# Patient Record
Sex: Male | Born: 1970 | Race: White | Hispanic: No | Smoking: Current every day smoker
Health system: Southern US, Community
[De-identification: ages and names within clinical notes are randomized; demographics above are authoritative.]

## PROBLEM LIST (undated history)

## (undated) DIAGNOSIS — I639 Cerebral infarction, unspecified: Secondary | ICD-10-CM

## (undated) DIAGNOSIS — E119 Type 2 diabetes mellitus without complications: Secondary | ICD-10-CM

## (undated) DIAGNOSIS — I10 Essential (primary) hypertension: Secondary | ICD-10-CM

## (undated) DIAGNOSIS — J45909 Unspecified asthma, uncomplicated: Secondary | ICD-10-CM

## (undated) HISTORY — PX: LEG SURGERY: SHX1003

## (undated) HISTORY — PX: BACK SURGERY: SHX140

## (undated) HISTORY — PX: CHOLECYSTECTOMY: SHX55

---

## 2013-07-06 ENCOUNTER — Emergency Department: Payer: Self-pay | Admitting: Emergency Medicine

## 2013-07-06 LAB — CBC
HCT: 47.5 % (ref 40.0–52.0)
HGB: 16.5 g/dL (ref 13.0–18.0)
MCHC: 34.8 g/dL (ref 32.0–36.0)
MCV: 90 fL (ref 80–100)
Platelet: 223 10*3/uL (ref 150–440)
RDW: 13 % (ref 11.5–14.5)
WBC: 11 10*3/uL — ABNORMAL HIGH (ref 3.8–10.6)

## 2013-07-06 LAB — BASIC METABOLIC PANEL
Anion Gap: 5 — ABNORMAL LOW (ref 7–16)
Calcium, Total: 9.2 mg/dL (ref 8.5–10.1)
Creatinine: 1.31 mg/dL — ABNORMAL HIGH (ref 0.60–1.30)
EGFR (Non-African Amer.): >60
Osmolality: 277 (ref 275–301)
Sodium: 135 mmol/L — ABNORMAL LOW (ref 136–145)

## 2017-01-25 ENCOUNTER — Emergency Department: Admission: EM | Admit: 2017-01-25 | Discharge: 2017-01-25 | Discharge status: Against Medical Advice | Payer: Self-pay

## 2017-01-28 ENCOUNTER — Telehealth: Payer: Self-pay | Admitting: Emergency Medicine

## 2017-01-28 NOTE — Telephone Encounter (Signed)
Called patient due to lwot to inquire about condition and follow up plans.  No answer and no voicemail  

## 2019-11-07 ENCOUNTER — Other Ambulatory Visit: Payer: Self-pay

## 2019-11-07 ENCOUNTER — Emergency Department: Payer: Medicare Other

## 2019-11-07 DIAGNOSIS — Z8673 Personal history of transient ischemic attack (TIA), and cerebral infarction without residual deficits: Secondary | ICD-10-CM | POA: Insufficient documentation

## 2019-11-07 DIAGNOSIS — J441 Chronic obstructive pulmonary disease with (acute) exacerbation: Secondary | ICD-10-CM | POA: Diagnosis not present

## 2019-11-07 DIAGNOSIS — Z20822 Contact with and (suspected) exposure to covid-19: Secondary | ICD-10-CM | POA: Insufficient documentation

## 2019-11-07 DIAGNOSIS — E119 Type 2 diabetes mellitus without complications: Secondary | ICD-10-CM | POA: Insufficient documentation

## 2019-11-07 DIAGNOSIS — Z9049 Acquired absence of other specified parts of digestive tract: Secondary | ICD-10-CM | POA: Diagnosis not present

## 2019-11-07 DIAGNOSIS — I10 Essential (primary) hypertension: Secondary | ICD-10-CM | POA: Diagnosis not present

## 2019-11-07 DIAGNOSIS — R519 Headache, unspecified: Secondary | ICD-10-CM | POA: Insufficient documentation

## 2019-11-07 DIAGNOSIS — J45909 Unspecified asthma, uncomplicated: Secondary | ICD-10-CM | POA: Insufficient documentation

## 2019-11-07 DIAGNOSIS — F17200 Nicotine dependence, unspecified, uncomplicated: Secondary | ICD-10-CM | POA: Insufficient documentation

## 2019-11-07 LAB — CBC
HCT: 54.8 % — ABNORMAL HIGH (ref 39.0–52.0)
Hemoglobin: 18.8 g/dL — ABNORMAL HIGH (ref 13.0–17.0)
MCH: 29.7 pg (ref 26.0–34.0)
MCHC: 34.3 g/dL (ref 30.0–36.0)
MCV: 86.7 fL (ref 80.0–100.0)
Platelets: 225 10*3/uL (ref 150–400)
RBC: 6.32 MIL/uL — ABNORMAL HIGH (ref 4.22–5.81)
RDW: 12.7 % (ref 11.5–15.5)
WBC: 10.7 10*3/uL — ABNORMAL HIGH (ref 4.0–10.5)
nRBC: 0 % (ref 0.0–0.2)

## 2019-11-07 NOTE — ED Triage Notes (Signed)
Patient coming [facility] for intermittent headache with vision loss and SOB. Patient given duoneb and solumendrol in route. Patient reports continued SOB. Patient with even and unlabored respirations, oxygen saturation of 96% on RA. Patient reports previous hx of stroke with lasting weakness to right side.

## 2019-11-07 NOTE — ED Notes (Signed)
Patient coming [facility] [place] [employer].   Intermittent headache posterior right head. Patient has hx of stroke. Patient has hx of asthma. Patient wheezing on ausculation. Patient given duoneb and 125 solumedrol in route. Wheezing resolved after treatments. Stroke screen negative. 12 lead unremarkable.  CBG 270.

## 2019-11-08 ENCOUNTER — Emergency Department
Admission: EM | Admit: 2019-11-08 | Discharge: 2019-11-08 | Disposition: A | Discharge status: Home / Self Care | Payer: Medicare Other | Attending: Emergency Medicine | Admitting: Emergency Medicine

## 2019-11-08 ENCOUNTER — Emergency Department: Payer: Medicare Other

## 2019-11-08 DIAGNOSIS — R519 Headache, unspecified: Secondary | ICD-10-CM | POA: Diagnosis not present

## 2019-11-08 DIAGNOSIS — J441 Chronic obstructive pulmonary disease with (acute) exacerbation: Secondary | ICD-10-CM

## 2019-11-08 HISTORY — DX: Type 2 diabetes mellitus without complications: E11.9

## 2019-11-08 HISTORY — DX: Cerebral infarction, unspecified: I63.9

## 2019-11-08 HISTORY — DX: Unspecified asthma, uncomplicated: J45.909

## 2019-11-08 HISTORY — DX: Essential (primary) hypertension: I10

## 2019-11-08 LAB — BASIC METABOLIC PANEL
Anion gap: 14 (ref 5–15)
BUN: 15 mg/dL (ref 6–20)
CO2: 25 mmol/L (ref 22–32)
Calcium: 9.1 mg/dL (ref 8.9–10.3)
Chloride: 96 mmol/L — ABNORMAL LOW (ref 98–111)
Creatinine, Ser: 1.06 mg/dL (ref 0.61–1.24)
GFR calc Af Amer: >60 mL/min (ref >60)
GFR calc non Af Amer: >60 mL/min (ref >60)
Glucose, Bld: 300 mg/dL — ABNORMAL HIGH (ref 70–99)
Potassium: 3.1 mmol/L — ABNORMAL LOW (ref 3.5–5.1)
Sodium: 135 mmol/L (ref 135–145)

## 2019-11-08 LAB — TROPONIN I (HIGH SENSITIVITY): Troponin I (High Sensitivity): 6 ng/L (ref <18)

## 2019-11-08 LAB — SARS CORONAVIRUS 2 (TAT 6-24 HRS): SARS Coronavirus 2: NEGATIVE

## 2019-11-08 MED ORDER — ACETAMINOPHEN 500 MG PO TABS
1000.0000 mg | ORAL_TABLET | Freq: Once | ORAL | Status: AC
  Administered 2019-11-08: 1000 mg via ORAL

## 2019-11-08 MED ORDER — PREDNISONE 20 MG PO TABS: 40.0000 mg | ORAL_TABLET | Freq: Every day | ORAL | 0 refills | Status: AC

## 2019-11-08 MED ORDER — IPRATROPIUM-ALBUTEROL 0.5-2.5 (3) MG/3ML IN SOLN
RESPIRATORY_TRACT | Status: AC
  Filled 2019-11-08: qty 6

## 2019-11-08 MED ORDER — POTASSIUM CHLORIDE CRYS ER 20 MEQ PO TBCR
40.0000 meq | EXTENDED_RELEASE_TABLET | Freq: Once | ORAL | Status: AC
  Administered 2019-11-08: 04:00:00 40 meq via ORAL
  Filled 2019-11-08: qty 2

## 2019-11-08 MED ORDER — ACETAMINOPHEN 500 MG PO TABS
ORAL_TABLET | ORAL | Status: AC
  Filled 2019-11-08: qty 2

## 2019-11-08 MED ORDER — IPRATROPIUM-ALBUTEROL 0.5-2.5 (3) MG/3ML IN SOLN
3.0000 mL | Freq: Once | RESPIRATORY_TRACT | Status: AC
  Administered 2019-11-08: 3 mL via RESPIRATORY_TRACT

## 2019-11-08 MED ORDER — IPRATROPIUM-ALBUTEROL 0.5-2.5 (3) MG/3ML IN SOLN
RESPIRATORY_TRACT | Status: AC
  Filled 2019-11-08: qty 3

## 2019-11-08 MED ORDER — AZITHROMYCIN 250 MG PO TABS: ORAL_TABLET | ORAL | 0 refills | Status: AC

## 2019-11-08 NOTE — ED Notes (Signed)
Pt states does have generalized headache. Pt states has not been feeling well for a few days. Pt denies fever. popsicle provided. Call bell at left side.

## 2019-11-08 NOTE — ED Provider Notes (Signed)
[facility] Emergency Department Provider Note  ____________________________________________   First MD Initiated Contact with Patient 11/08/19 0200     (approximate)  I have reviewed the triage vital signs and the nursing notes.   HISTORY  Chief Complaint Shortness of Breath and Headache    HPI Steven Chandler is a 49 y.o. male with history of hypertension, diabetes, stroke, asthma who comes in with posterior right headache as well shortness of breath.  Patient was given DuoNeb and Solu-Medrol on route.  Patient states that he has had some shortness of breath over the past 3 days, worse with exertion, better at rest.  He is been using his albuterol inhaler with some relief in symptoms.  Also stated he started to have some mild headaches that were a shooting sensation in different parts of his head that come and go.  Has a history of a stroke and was going to make sure he looked okay.  He reported some occasional blurry vision in his left eye that would last for a few seconds previously but currently his vision is completely normal.  Denies any pain behind his eye.          Past Medical History:  Diagnosis Date  . Asthma   . Diabetes mellitus without complication (HCC)   . Hypertension   . Stroke [facility])     There are no problems to display for this patient.   Past Surgical History:  Procedure Laterality Date  . BACK SURGERY    . CHOLECYSTECTOMY    . LEG SURGERY      Prior to Admission medications   Not on File    Allergies Valium [diazepam]  No family history on file.  Social History Social History   Tobacco Use  . Smoking status: Current Every Day Smoker  . Smokeless tobacco: Never Used  Substance Use Topics  . Alcohol use: Not Currently  . Drug use: Not on file      Review of Systems Constitutional: No fever/chills Eyes: Vision change now resolved ENT: No sore throat. Cardiovascular: No chest pain Respiratory: Positive  for SOB Gastrointestinal: No abdominal pain.  No nausea, no vomiting.  No diarrhea.  No constipation. Genitourinary: Negative for dysuria. Musculoskeletal: Negative for back pain. Skin: Negative for rash. Neurological: Negative for headaches, focal weakness or numbness. All other ROS negative ____________________________________________   PHYSICAL EXAM:  VITAL SIGNS: ED Triage Vitals  Enc Vitals Group     BP 11/07/19 2241 121/85     Pulse Rate 11/07/19 2241 (!) 109     Resp 11/07/19 2241 18     Temp 11/07/19 2241 98.8 F (37.1 C)     Temp Source 11/07/19 2241 Oral     SpO2 11/07/19 2241 94 %     Weight 11/07/19 2242 237 lb (107.5 kg)     Height 11/07/19 2242 5\' 11"  (1.803 m)     Head Circumference --      Peak Flow --      Pain Score 11/07/19 2323 4     Pain Loc --      Pain Edu? --      Excl. in GC? --     Constitutional: Alert and oriented. Well appearing and in no acute distress. Eyes: Conjunctivae are normal. EOMI. Head: Atraumatic. Nose: No congestion/rhinnorhea. Mouth/Throat: Mucous membranes are moist.   Neck: No stridor. Trachea Midline. FROM Cardiovascular: Tachycardic, regular rhythm. Grossly normal heart sounds.  Good peripheral circulation. Respiratory: Wheezing bilaterally, no increased  work of breathing Gastrointestinal: Soft and nontender. No distention. No abdominal bruits.  Musculoskeletal: No lower extremity tenderness nor edema.  No joint effusions. Neurologic:  Normal speech and language. No gross focal neurologic deficits are appreciated.  Skin:  Skin is warm, dry and intact. No rash noted. Psychiatric: Mood and affect are normal. Speech and behavior are normal. GU: Deferred   ____________________________________________   LABS (all labs ordered are listed, but only abnormal results are displayed)  Labs Reviewed  BASIC METABOLIC PANEL - Abnormal; Notable for the following components:      Result Value   Potassium 3.1 (*)    Chloride 96 (*)     Glucose, Bld 300 (*)    All other components within normal limits  CBC - Abnormal; Notable for the following components:   WBC 10.7 (*)    RBC 6.32 (*)    Hemoglobin 18.8 (*)    HCT 54.8 (*)    All other components within normal limits  SARS CORONAVIRUS 2 (TAT 6-24 HRS)  TROPONIN I (HIGH SENSITIVITY)   ____________________________________________   ED ECG REPORT I, Concha Se, the attending physician, personally viewed and interpreted this ECG.  Sinus tachycardia rate of 101, no ST elevation, no T wave inversions, right axis deviation ____________________________________________  RADIOLOGY Vela Prose, personally viewed and evaluated these images (plain radiographs) as part of my medical decision making, as well as reviewing the written report by the radiologist.  ED MD interpretation:  No pna   Official radiology report(s): DG Chest 2 View  Result Date: 11/07/2019 CLINICAL DATA:  Shortness of breath EXAM: CHEST - 2 VIEW COMPARISON:  04/24/2016 FINDINGS: The heart size and mediastinal contours are within normal limits. Both lungs are clear. The visualized skeletal structures are unremarkable. IMPRESSION: No active cardiopulmonary disease. Electronically Signed   By: Katherine Mantle M.D.   On: 11/07/2019 23:48   CT Head Wo Contrast  Result Date: 11/08/2019 CLINICAL DATA:  Intermittent posterior right-sided headache EXAM: CT HEAD WITHOUT CONTRAST TECHNIQUE: Contiguous axial images were obtained from the base of the skull through the vertex without intravenous contrast. COMPARISON:  12/22/2016 FINDINGS: Brain: No acute infarct or hemorrhage. Lateral ventricles and midline structures are unremarkable. No acute extra-axial fluid collections. No mass effect. Vascular: No hyperdense vessel or unexpected calcification. Skull: Normal. Negative for fracture or focal lesion. Sinuses/Orbits: No acute finding. Other: None IMPRESSION: 1. No acute intracranial process. Electronically  Signed   By: Sharlet Salina M.D.   On: 11/08/2019 01:08    ____________________________________________   PROCEDURES  Procedure(s) performed (including Critical Care):  Procedures   ____________________________________________   INITIAL IMPRESSION / ASSESSMENT AND PLAN / ED COURSE   Steven Chandler was evaluated in Emergency Department on 11/08/2019 for the symptoms described in the history of present illness. He was evaluated in the context of the global COVID-19 pandemic, which necessitated consideration that the patient might be at risk for infection with the SARS-CoV-2 virus that causes COVID-19. Institutional protocols and algorithms that pertain to the evaluation of patients at risk for COVID-19 are in a state of rapid change based on information released by regulatory bodies including the CDC and federal and state organizations. These policies and algorithms were followed during the patient's care in the ED.     Pt presents with SOB.  This is most likely secondary to his asthma.  Patient also reports smoking a pack daily.  Patient has bilateral wheezing.  Patient was tachycardic but I suspect this is  from the recent albuterol use.  For patient's headache CT head was ordered in triage.  Patient denies any vision changes at this time.  Pupils equal reactive unlikely glaucoma.  Extraocular movements are intact.  Patient is already on aspirin for stroke prophylaxis and denies any new stroke deficits.  Suspect the brief second of changes in vision is secondary to migraines however patient will follow up with his ophthalmologist given he is at baseline at this time  PNA-will get xray to evaluation Anemia-CBC to evaluate ACS- will get trops Arrhythmia-Will get EKG and keep on monitor.  COVID- will get testing per algorithm. PE-lower suspicion given no risk factors and other cause more likely  CT head was negative.  Chest x-ray negative.  Labs are reassuring.  Patient feels much better  after DuoNeb's.  Ambulating sat of 94% without any shortness of breath.  Patient is comfortable with discharge home.  Covid swab pending.  Patient understands to quarantine at home  I discussed the provisional nature of ED diagnosis, the treatment so far, the ongoing plan of care, follow up appointments and return precautions with the patient and any family or support people present. They expressed understanding and agreed with the plan, discharged home.   ____________________________________________   FINAL CLINICAL IMPRESSION(S) / ED DIAGNOSES   Final diagnoses:  COPD exacerbation ([place])  Bad headache     MEDICATIONS GIVEN DURING THIS VISIT:  Medications  ipratropium-albuterol (DUONEB) 0.5-2.5 (3) MG/3ML nebulizer solution (has no administration in time range)  potassium chloride SA (KLOR-CON) CR tablet 40 mEq (has no administration in time range)  ipratropium-albuterol (DUONEB) 0.5-2.5 (3) MG/3ML nebulizer solution 3 mL (3 mLs Nebulization Given 11/08/19 0219)  ipratropium-albuterol (DUONEB) 0.5-2.5 (3) MG/3ML nebulizer solution 3 mL (3 mLs Nebulization Given 11/08/19 0219)  acetaminophen (TYLENOL) tablet 1,000 mg (1,000 mg Oral Given 11/08/19 0220)     ED Discharge Orders         Ordered    predniSONE (DELTASONE) 20 MG tablet  Daily     11/08/19 0334    azithromycin (ZITHROMAX Z-PAK) 250 MG tablet     11/08/19 6283           Note:  This document was prepared using Dragon voice recognition software and may include unintentional dictation errors.   Vanessa Fulton, MD 11/08/19 [phone]

## 2019-11-08 NOTE — ED Notes (Signed)
Report to michele, rn.  

## 2019-11-08 NOTE — ED Notes (Signed)
Patient ambulated in hallway with oxygen saturations 94 - 96% on room air. Patient denies shortness of breath.

## 2019-11-08 NOTE — Discharge Instructions (Addendum)
Your CT head was negative for new process.  We are treating you for an asthma flare.  Follow-up with your ophthalmologist peer return to the ER for any other concerns.  Stay quarantine at home until your Covid test comes back

## 2020-03-24 ENCOUNTER — Ambulatory Visit: Payer: Medicare Other | Attending: Neurology

## 2020-05-17 ENCOUNTER — Ambulatory Visit: Payer: Medicare Other | Attending: Neurology

## 2020-10-19 ENCOUNTER — Ambulatory Visit: Payer: Medicare Other | Attending: Neurology

## 2020-10-19 DIAGNOSIS — G4700 Insomnia, unspecified: Secondary | ICD-10-CM | POA: Insufficient documentation

## 2020-10-19 DIAGNOSIS — E669 Obesity, unspecified: Secondary | ICD-10-CM | POA: Diagnosis not present

## 2020-10-19 DIAGNOSIS — I10 Essential (primary) hypertension: Secondary | ICD-10-CM | POA: Diagnosis not present

## 2020-10-19 DIAGNOSIS — G4730 Sleep apnea, unspecified: Secondary | ICD-10-CM | POA: Diagnosis present

## 2020-10-20 ENCOUNTER — Other Ambulatory Visit: Payer: Self-pay

## 2020-11-10 ENCOUNTER — Ambulatory Visit: Payer: Medicare HMO | Attending: Neurology

## 2020-11-24 ENCOUNTER — Ambulatory Visit: Payer: Medicare HMO | Attending: Neurology

## 2020-12-03 IMAGING — CT CT HEAD W/O CM
3 series · 15 of 47 positions shown, 18 images · non-contrast
Comparison: 12/22/2016

CLINICAL DATA: Intermittent posterior right-sided headache

EXAM:
CT HEAD WITHOUT CONTRAST
TECHNIQUE: Contiguous axial images were obtained from the base of the skull
through the vertex without intravenous contrast.

[Series 2: head wo · axial · 0.45mm/px · z∈[+203,+328]mm · 9 of 31 slices shown, 12 images]
[im 3/31  brain]
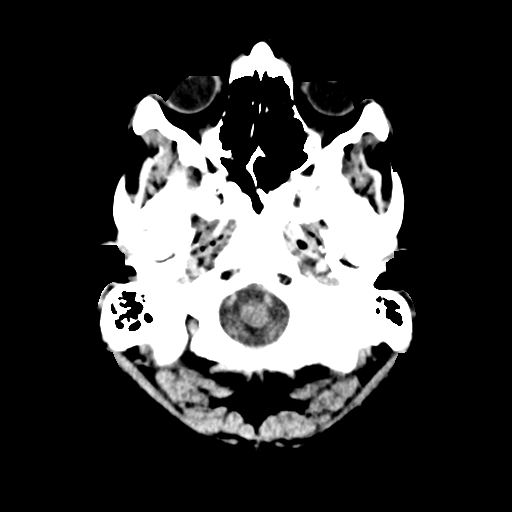
[im 3/31  bone]
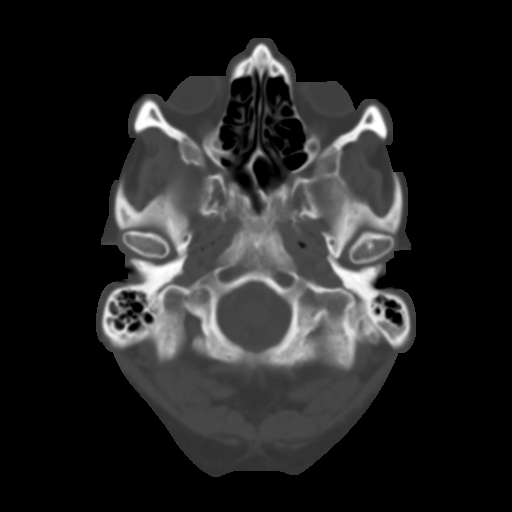
[im 6/31  brain]
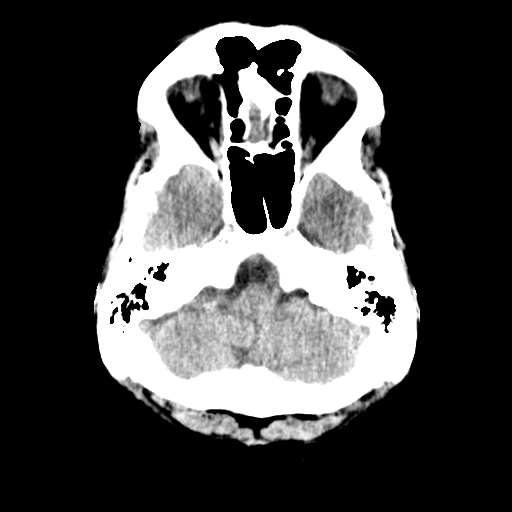
[im 9/31  brain]
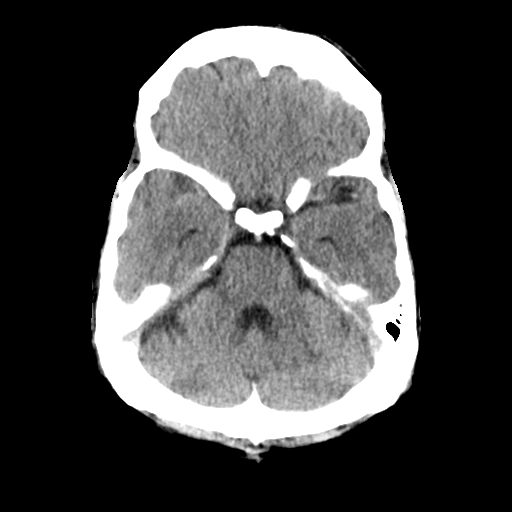
[im 12/31  brain]
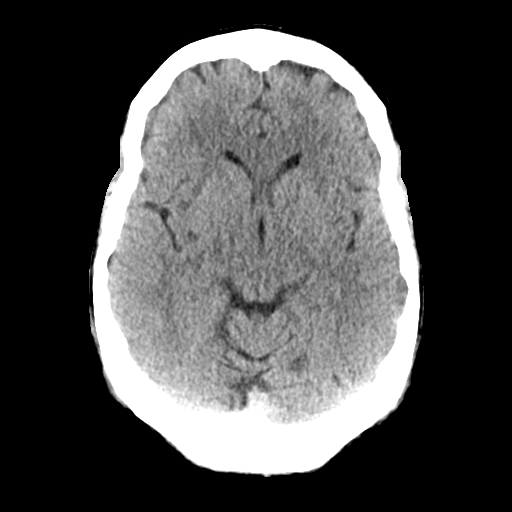
[im 16/31  brain]
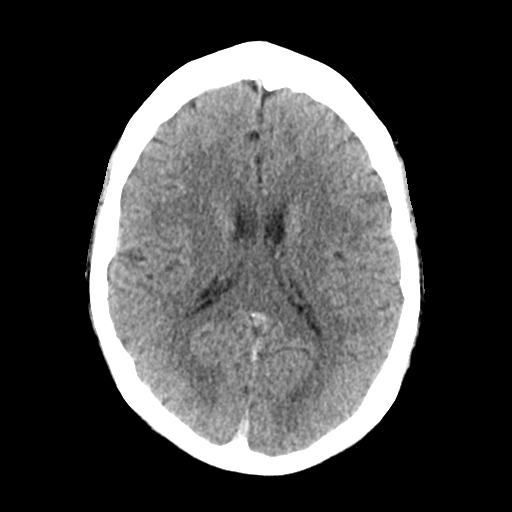
[im 16/31  bone]
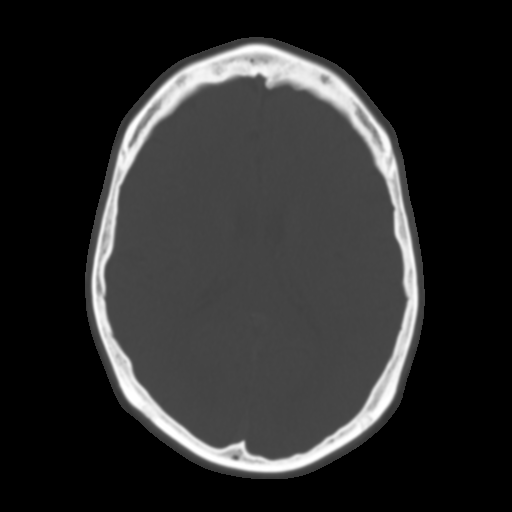
[im 19/31  brain]
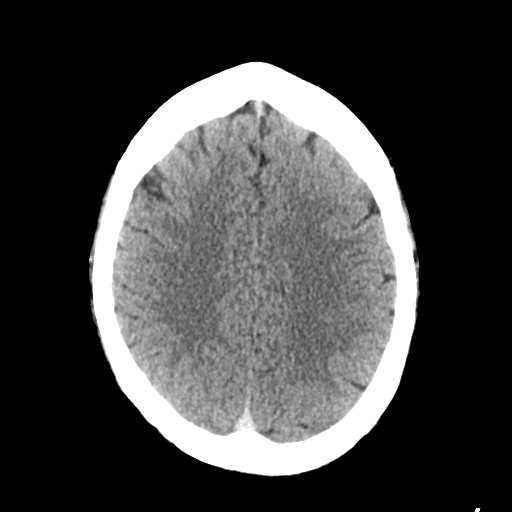
[im 22/31  brain]
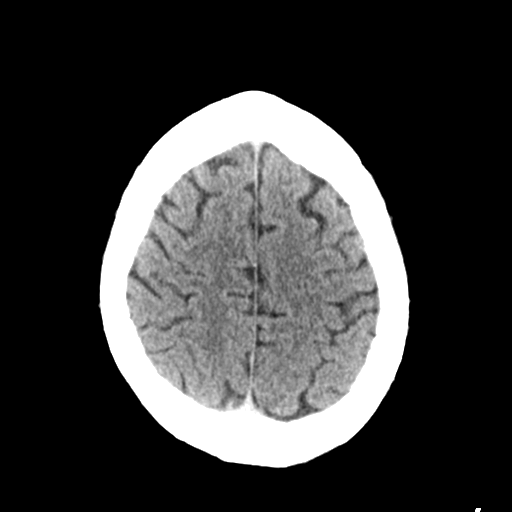
[im 25/31  brain]
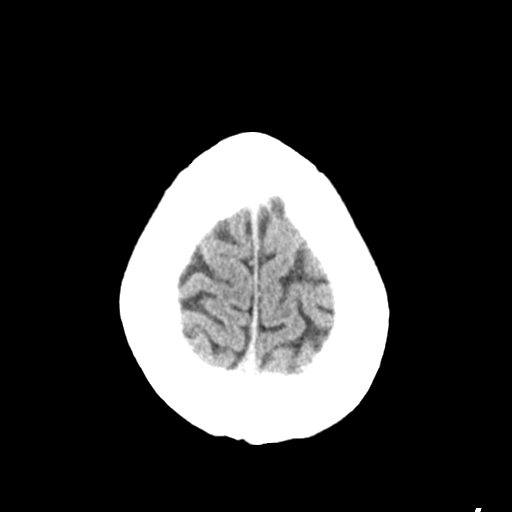
[im 28/31  brain]
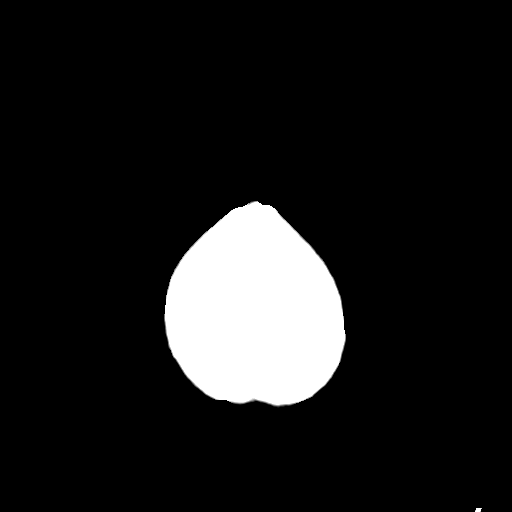
[im 28/31  bone]
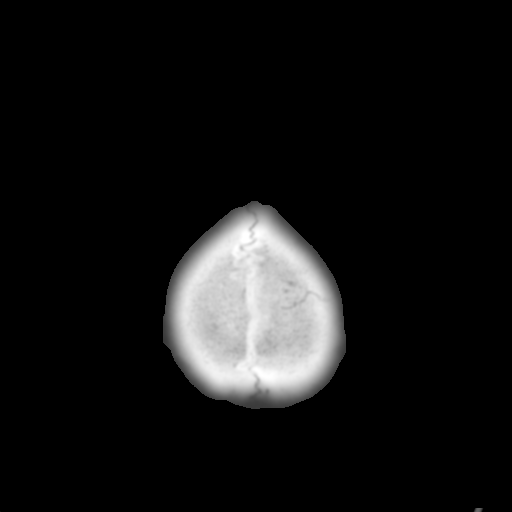

[Series 4: coronal soft tissue · coronal · 0.29mm/px · 3 of 68 slices shown]
[im 23/68  brain]
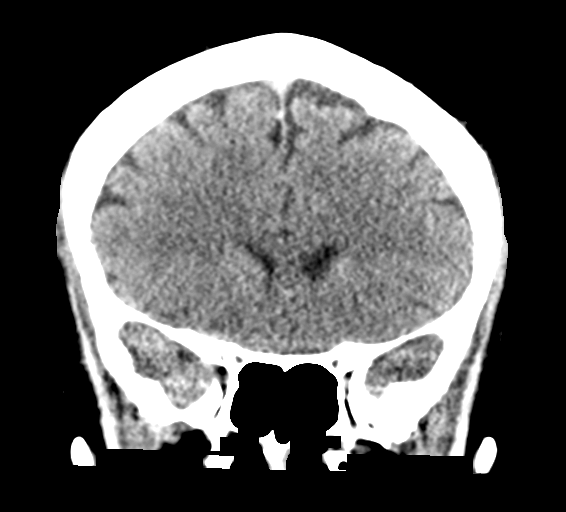
[im 30/68  brain]
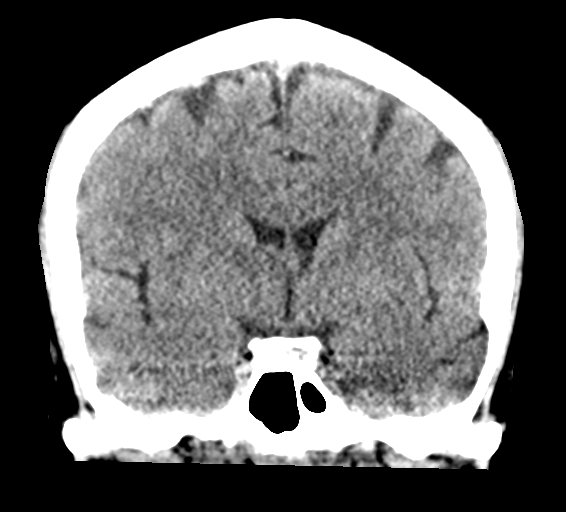
[im 38/68  brain]
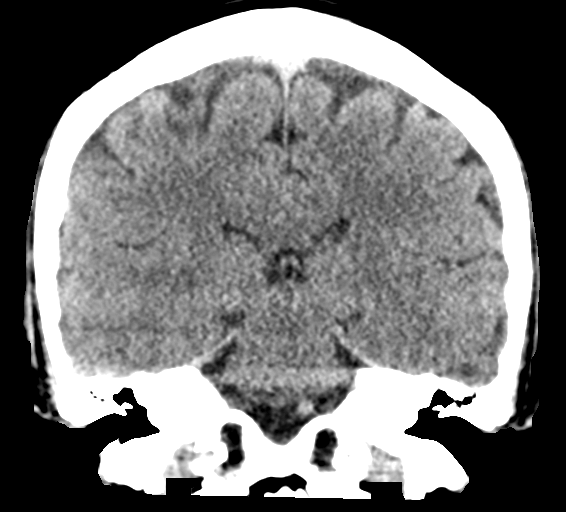

[Series 5: sagittal soft tissue · sagittal · 0.29mm/px · 3 of 50 slices shown]
[im 17/50  brain]
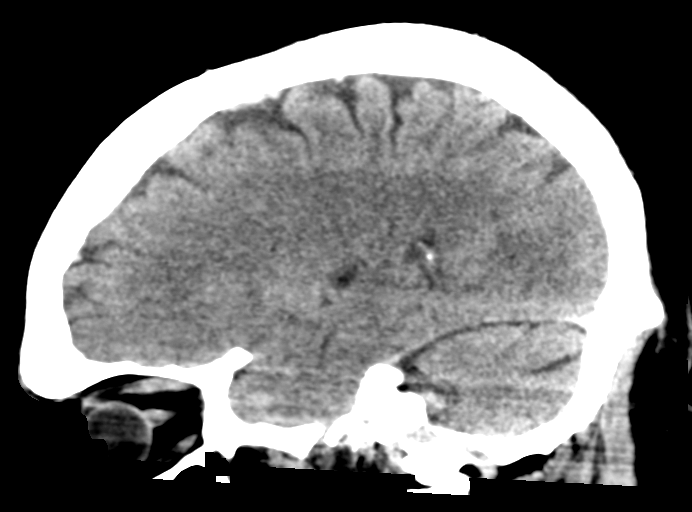
[im 25/50  brain]
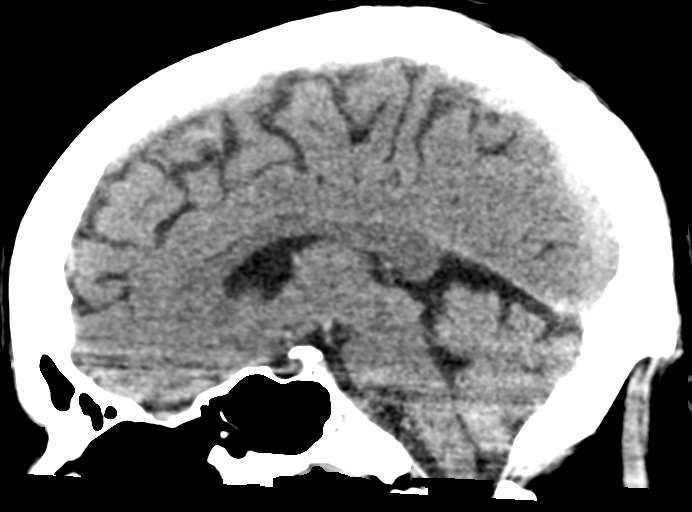
[im 33/50  brain]
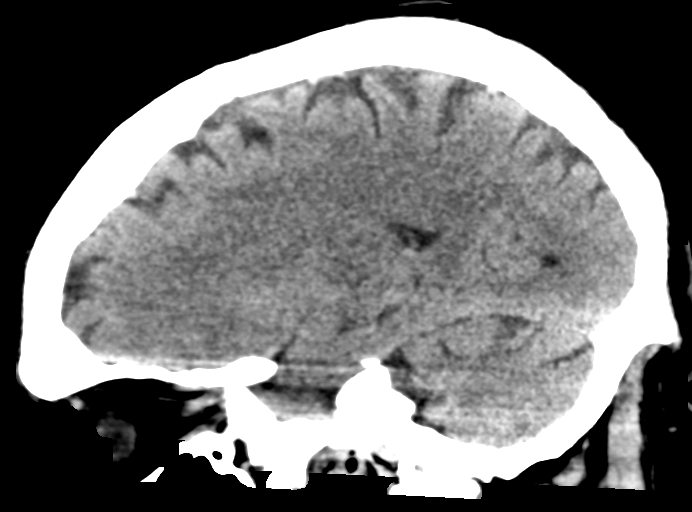

[15 of 47 positions shown; findings below may reference images not displayed]

FINDINGS: Brain: No acute infarct or hemorrhage. Lateral ventricles and
midline structures are unremarkable. No acute extra-axial fluid
collections. No mass effect.

Vascular: No hyperdense vessel or unexpected calcification.

Skull: Normal. Negative for fracture or focal lesion.

Sinuses/Orbits: No acute finding.

Other: None
IMPRESSION: 1. No acute intracranial process.

## 2024-06-22 ENCOUNTER — Encounter: Payer: Self-pay | Admitting: Emergency Medicine

## 2024-07-09 ENCOUNTER — Ambulatory Visit
